# Patient Record
Sex: Male | Born: 1985 | Race: White | Hispanic: No | Marital: Single | Smoking: Never smoker
Health system: Southern US, Community
[De-identification: ages and names within clinical notes are randomized; demographics above are authoritative.]

---

## 2011-04-19 ENCOUNTER — Emergency Department (HOSPITAL_COMMUNITY): Payer: Worker's Compensation

## 2011-04-19 ENCOUNTER — Emergency Department (HOSPITAL_COMMUNITY)
Admission: EM | Admit: 2011-04-19 | Discharge: 2011-04-19 | Disposition: A | Discharge status: Home / Self Care | Payer: Worker's Compensation | Attending: Emergency Medicine | Admitting: Emergency Medicine

## 2011-04-19 DIAGNOSIS — M545 Low back pain, unspecified: Secondary | ICD-10-CM | POA: Insufficient documentation

## 2011-04-19 DIAGNOSIS — Z23 Encounter for immunization: Secondary | ICD-10-CM | POA: Insufficient documentation

## 2011-04-19 DIAGNOSIS — Y92410 Unspecified street and highway as the place of occurrence of the external cause: Secondary | ICD-10-CM | POA: Insufficient documentation

## 2011-04-19 DIAGNOSIS — T148XXA Other injury of unspecified body region, initial encounter: Secondary | ICD-10-CM

## 2011-04-19 DIAGNOSIS — IMO0002 Reserved for concepts with insufficient information to code with codable children: Secondary | ICD-10-CM | POA: Insufficient documentation

## 2011-04-19 MED ORDER — OXYCODONE-ACETAMINOPHEN 5-325 MG PO TABS: 1.0000 | ORAL_TABLET | ORAL | Status: AC | PRN

## 2011-04-19 MED ORDER — IBUPROFEN 600 MG PO TABS: 600.0000 mg | ORAL_TABLET | Freq: Four times a day (QID) | ORAL | Status: AC | PRN

## 2011-04-19 MED ORDER — TETANUS-DIPHTH-ACELL PERTUSSIS 5-2.5-18.5 LF-MCG/0.5 IM SUSP
0.5000 mL | Freq: Once | INTRAMUSCULAR | Status: AC
  Administered 2011-04-19: 0.5 mL via INTRAMUSCULAR
  Filled 2011-04-19: qty 0.5

## 2011-04-19 NOTE — ED Notes (Signed)
Pt on spine board, c-collar, family at bedside
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
## 2011-04-19 NOTE — ED Notes (Signed)
Pt back from xray.  Family at bedside.  nad noted

## 2011-07-14 ENCOUNTER — Other Ambulatory Visit (HOSPITAL_COMMUNITY): Payer: Self-pay | Admitting: Urology

## 2011-07-14 DIAGNOSIS — D294 Benign neoplasm of scrotum: Secondary | ICD-10-CM

## 2011-07-15 ENCOUNTER — Ambulatory Visit (HOSPITAL_COMMUNITY)
Admission: RE | Admit: 2011-07-15 | Discharge: 2011-07-15 | Disposition: A | Discharge status: Home / Self Care | Payer: BC Managed Care – PPO | Source: Ambulatory Visit | Attending: Urology | Admitting: Urology

## 2011-07-15 DIAGNOSIS — D294 Benign neoplasm of scrotum: Secondary | ICD-10-CM
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
## 2012-11-23 IMAGING — US US SCROTUM
1 series · 14 of 25 positions shown · non-contrast
Comparison: None.

CLINICAL DATA: Scrotal neoplasm.

ULTRASOUND OF SCROTUM
TECHNIQUE: Complete ultrasound examination of the testicles,
epididymis, and other scrotal structures was performed.

[Series 1: us scrotum · 0.08mm/px · 14 of 62 slices shown]
[im 1/62]
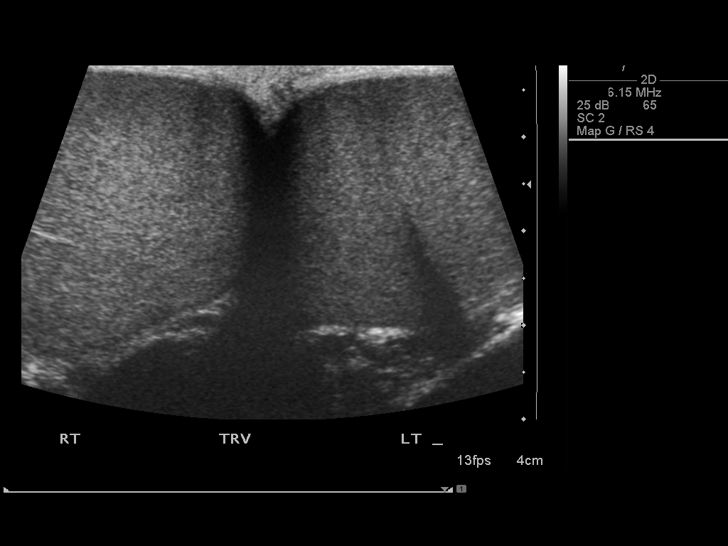
[im 6/62]
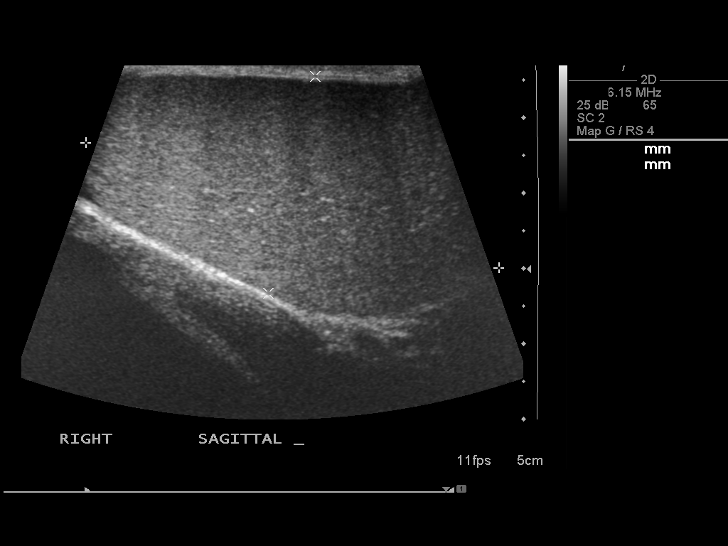
[im 11/62]
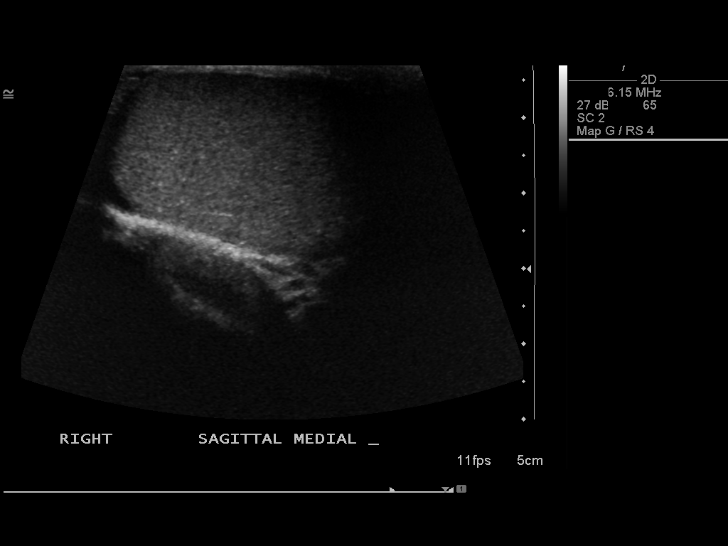
[im 16/62]
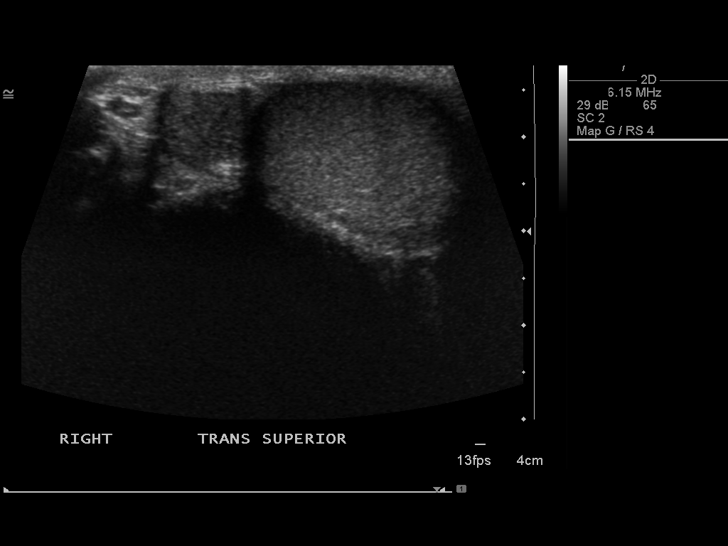
[im 21/62]
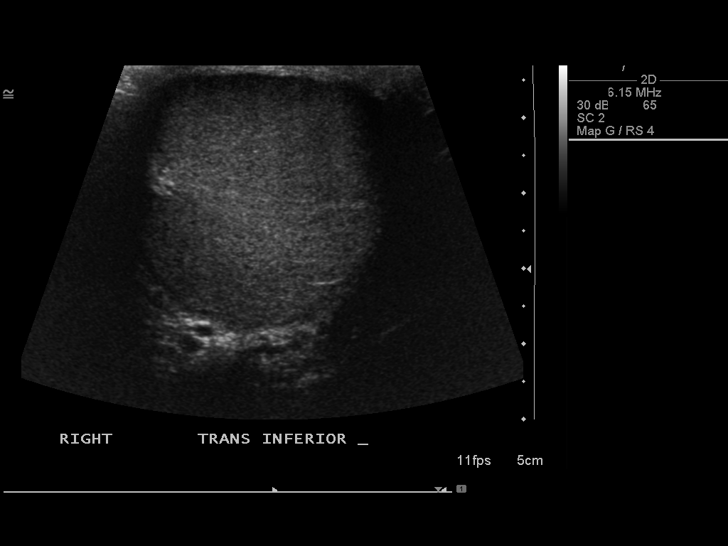
[im 23/62]
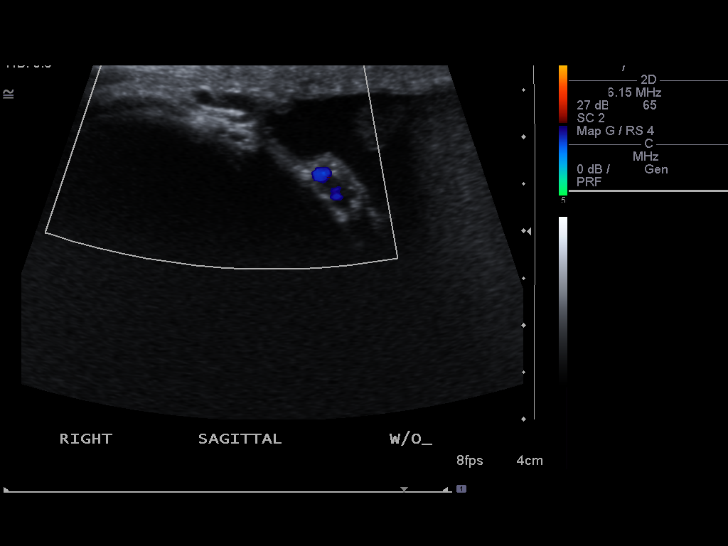
[im 28/62]
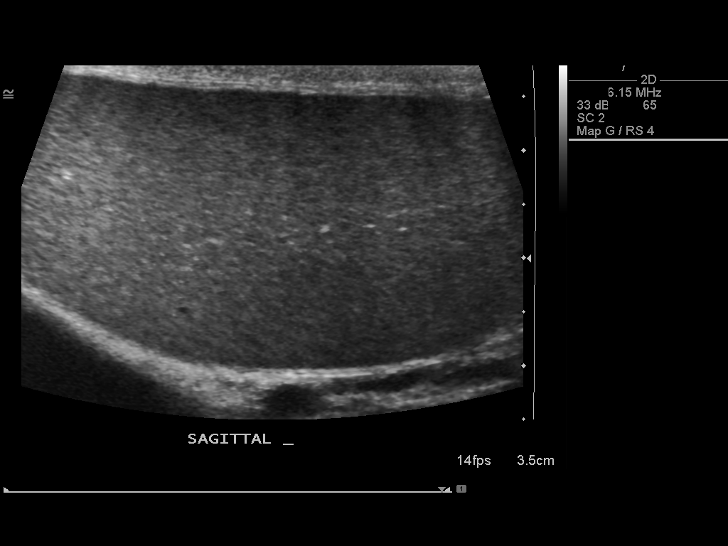
[im 34/62]
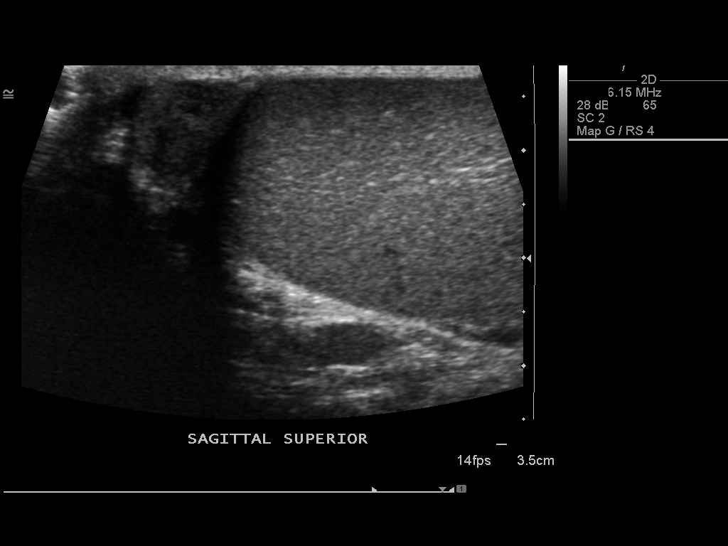
[im 39/62]
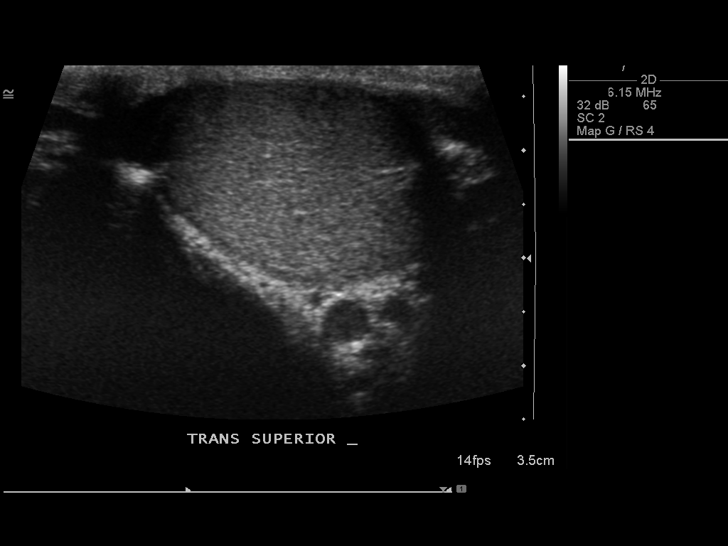
[im 41/62]
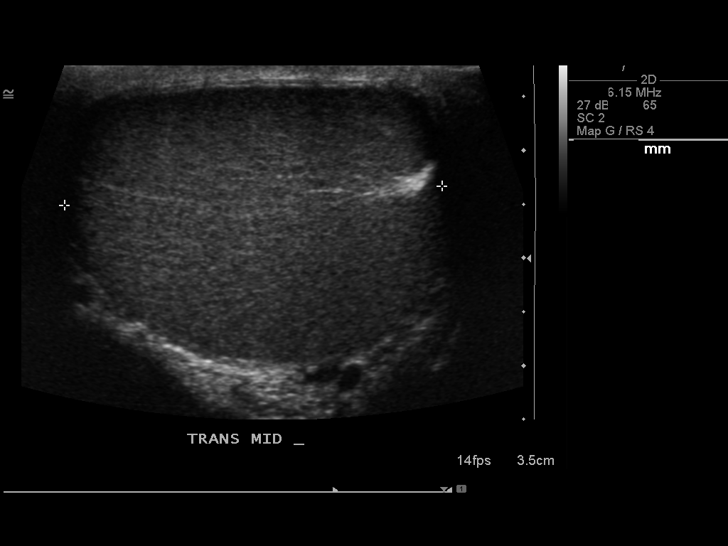
[im 46/62]
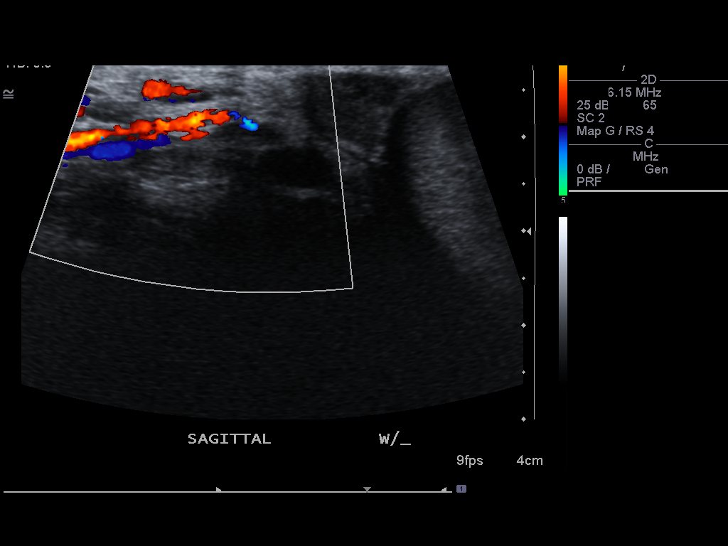
[im 51/62]
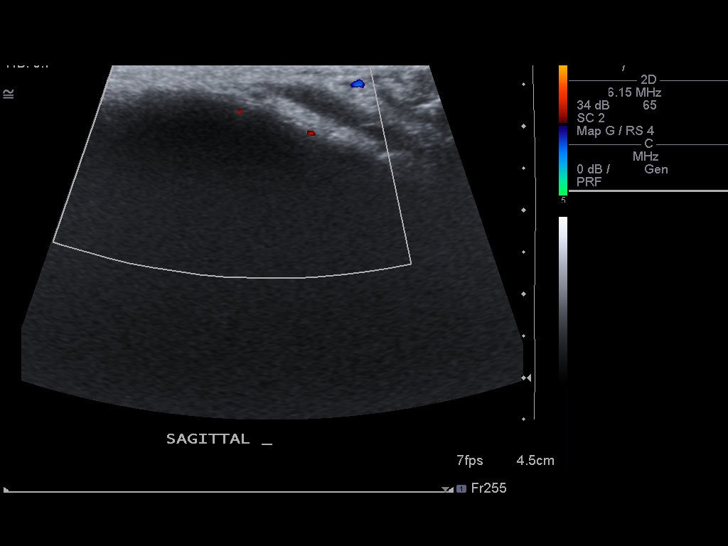
[im 56/62]
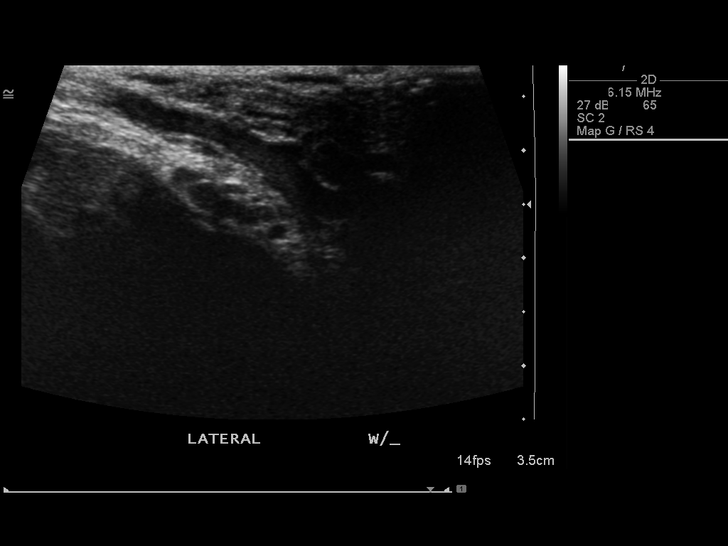
[im 62/62]
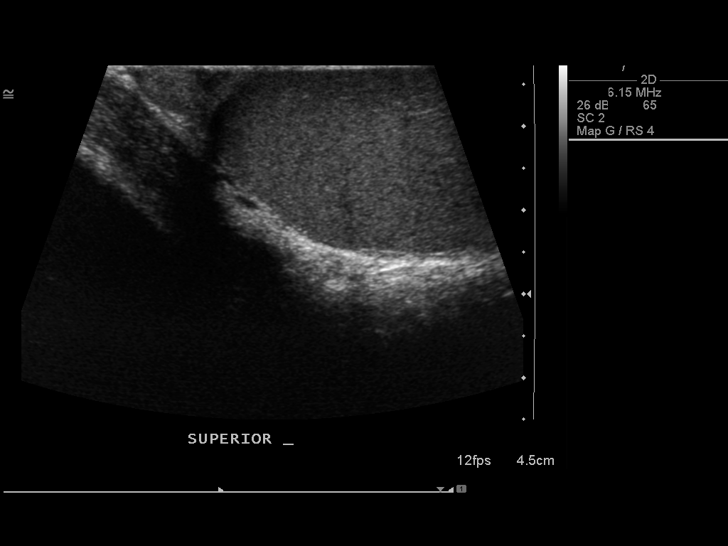

[14 of 25 positions shown; findings below may reference images not displayed]

FINDINGS: Right testis:  5.7 x 2.9 x 3.5 cm.  There is perfusion to the right
testicle.

Left testis:  4.8 x 2.3 x 3.5 cm.  There is perfusion to the left
testicle.

Right epididymis:  Normal in size and appearance.

Left epididymis:  Normal in size and appearance.

Hydrocele:  Absent.

Varicocele:  Left varicocele.
IMPRESSION: No mass is identified in the scrotum other than the left
varicocele.  The testicles are normal.

## 2020-06-11 ENCOUNTER — Emergency Department (HOSPITAL_COMMUNITY): Payer: BC Managed Care – PPO

## 2020-06-11 ENCOUNTER — Other Ambulatory Visit: Payer: Self-pay

## 2020-06-11 ENCOUNTER — Encounter (HOSPITAL_COMMUNITY): Payer: Self-pay

## 2020-06-11 DIAGNOSIS — R0602 Shortness of breath: Secondary | ICD-10-CM | POA: Diagnosis present

## 2020-06-11 DIAGNOSIS — U071 COVID-19: Secondary | ICD-10-CM | POA: Diagnosis not present

## 2020-06-11 NOTE — ED Triage Notes (Addendum)
Pt reports a positive home covid test and tested positive [facility] after a family trip. Sx since Thursday. Pt c/o generalized body aches, headache, shob, and cough.

## 2020-06-12 ENCOUNTER — Emergency Department (HOSPITAL_COMMUNITY)
Admission: EM | Admit: 2020-06-12 | Discharge: 2020-06-12 | Disposition: A | Discharge status: Home / Self Care | Payer: BC Managed Care – PPO | Attending: Emergency Medicine | Admitting: Emergency Medicine

## 2020-06-12 DIAGNOSIS — U071 COVID-19: Secondary | ICD-10-CM

## 2020-06-12 MED ORDER — ALBUTEROL SULFATE HFA 108 (90 BASE) MCG/ACT IN AERS
2.0000 | INHALATION_SPRAY | Freq: Once | RESPIRATORY_TRACT | Status: AC
  Administered 2020-06-12: 2 via RESPIRATORY_TRACT
  Filled 2020-06-12: qty 6.7

## 2020-06-12 NOTE — Discharge Instructions (Signed)
Thank you for allowing me to care for you today in the Emergency Department.   You can use 2 to 4 puffs of the albuterol inhaler every 4-6 hours as needed for shortness of breath.  Guaifenesin is available over-the-counter and can help with nasal drainage and congestion.  Use as directed on the label.  You can continue to use Tylenol and Motrin for headache, body aches, and fever.  Please note that your symptoms can last several weeks.  Some individuals may develop long COVID, which can last for months.  You can follow-up with your primary care provider or with the COVID-19 clinic for reevaluation as needed.  Return to the emergency department if you develop respiratory distress, if you pass out, if your fingers or lips turn blue, if you become very sleepy and difficult to wake up, if you develop uncontrollable vomiting and stomach in urine, or have other new, concerning symptoms.

## 2020-06-12 NOTE — ED Provider Notes (Signed)
[facility]-EMERGENCY DEPT Provider Note   CSN: [id number] Arrival date & time: 06/11/20  2257     History Chief Complaint  Patient presents with  . Generalized Body Aches    Dean Mccoy is a 35 y.o. male with no significant past medical history who presents the emergency department with a chief complaint of shortness of breath.  The patient reports that he tested positive for COVID-19 on 12/30 after several family members tested positive during a road trip.  He reports that he has had loss of sense of taste and smell, sore throat, headache, myalgias, fatigue, chills, fever, cough.  He has had mild shortness of breath that has been gradually worsening.  No chest pain.  No vomiting.  No abdominal pain, neck pain or stiffness, confusion, or rash.  He has been treating his symptoms with Mucinex, Tylenol, and Motrin.  His primary concern is that his shortness of breath has been gradually worsening and that all of his family members who tested positive the same day had their symptoms resolved in 3 to 4 days, but his have persisted for a week.  He also reports that he has been having postnasal drip despite using over-the-counter medication and is concerned it may cause complications. He is unvaccinated against COVID-19.  The history is provided by the patient and medical records. No language interpreter was used.       History reviewed. No pertinent past medical history.  There are no problems to display for this patient.   History reviewed. No pertinent surgical history.     No family history on file.  Social History   Tobacco Use  . Smoking status: Never Smoker  . Smokeless tobacco: Never Used  Substance Use Topics  . Alcohol use: No  . Drug use: No    Home Medications Prior to Admission medications   Not on File    Allergies    Patient has no known allergies.  Review of Systems   Review of Systems  Constitutional: Positive for chills and fever.  Negative for appetite change.  HENT: Positive for congestion, postnasal drip and sore throat.        Loss of sense of taste and smell  Eyes: Negative for visual disturbance.  Respiratory: Positive for cough and shortness of breath.   Cardiovascular: Negative for chest pain, palpitations and leg swelling.  Gastrointestinal: Positive for diarrhea. Negative for abdominal pain, nausea and vomiting.  Genitourinary: Negative for dysuria.  Musculoskeletal: Negative for back pain and myalgias.  Skin: Negative for rash.  Allergic/Immunologic: Negative for immunocompromised state.  Neurological: Negative for seizures, syncope, weakness, numbness and headaches.  Psychiatric/Behavioral: Negative for confusion.    Physical Exam Updated Vital Signs BP 131/77   Pulse 80   Temp 98.2 F (36.8 C)   Resp 17   SpO2 99%   Physical Exam Vitals and nursing note reviewed.  Constitutional:      Appearance: He is well-developed. He is not ill-appearing, toxic-appearing or diaphoretic.  HENT:     Head: Normocephalic.     Right Ear: Tympanic membrane, ear canal and external ear normal.     Left Ear: Tympanic membrane, ear canal and external ear normal.     Nose: Congestion present. No rhinorrhea.     Mouth/Throat:     Mouth: Mucous membranes are moist.     Pharynx: Posterior oropharyngeal erythema present.  Eyes:     General: No scleral icterus.    Conjunctiva/sclera: Conjunctivae normal.  Cardiovascular:  Rate and Rhythm: Normal rate and regular rhythm.     Pulses: Normal pulses.     Heart sounds: Normal heart sounds. No murmur heard. No friction rub. No gallop.   Pulmonary:     Effort: Pulmonary effort is normal. No respiratory distress.     Breath sounds: No stridor. No wheezing, rhonchi or rales.     Comments: Lungs are clear to auscultation bilaterally with good air movement throughout.  No increased work of breathing.  Able to speak in complete, fluent sentences without increased work of  breathing. Chest:     Chest wall: No tenderness.  Abdominal:     General: There is no distension.     Palpations: Abdomen is soft. There is no mass.     Tenderness: There is no abdominal tenderness. There is no right CVA tenderness, left CVA tenderness, guarding or rebound.     Hernia: No hernia is present.  Musculoskeletal:     Cervical back: Neck supple.  Skin:    General: Skin is warm and dry.  Neurological:     Mental Status: He is alert.  Psychiatric:        Behavior: Behavior normal.     ED Results / Procedures / Treatments   Labs (all labs ordered are listed, but only abnormal results are displayed) Labs Reviewed - No data to display  EKG None  Radiology DG Chest 2 View  Result Date: 06/11/2020 CLINICAL DATA:  35 year old male with shortness of breath and cough. Positive COVID-19. EXAM: CHEST - 2 VIEW COMPARISON:  None. FINDINGS: The heart size and mediastinal contours are within normal limits. Both lungs are clear. The visualized skeletal structures are unremarkable. IMPRESSION: No active cardiopulmonary disease. Electronically Signed   By: Anner Crete M.D.   On: 06/11/2020 23:32    Procedures Procedures (including critical care time)  Medications Ordered in ED Medications  albuterol (VENTOLIN HFA) 108 (90 Base) MCG/ACT inhaler 2 puff (2 puffs Inhalation Given 06/12/20 0521)    ED Course  I have reviewed the triage vital signs and the nursing notes.  Pertinent labs & imaging results that were available during my care of the patient were reviewed by me and considered in my medical decision making (see chart for details).    MDM Rules/Calculators/A&P                          35 year old male with no significant past medical history who is unvaccinated against COVID-19 who tested positive with an at-home test on 12/30 and then later had a second positive test [facility] after going on a road trip with several family members who also tested positive for COVID-19.   He presents today for continued symptoms after 1 week with mild, gradually worsening shortness of breath.   Vital signs are stable.  Oxygen saturation is 96%.  Physical exam is reassuring with no increased work of breathing.  Lungs are clear to auscultation bilaterally.  Discussed course of the viral illness and that he may have symptoms for several weeks.  Also discussed that there is the potential to develop long COVID.  We will discharge the patient with an albuterol inhaler.  Recommended guaifenesin as an expectorant.  All questions answered.  Home quarantine per cautions discussed.  The patient is hemodynamically stable and in no acute distress.  Safer discharge to home with outpatient follow-up as needed.  Final Clinical Impression(s) / ED Diagnoses Final diagnoses:  [id number]  Rx / DC Orders ED Discharge Orders    None       Joanne Gavel, PA-C 06/12/20 [id number]    Fatima Blank, MD 06/12/20 [phone]

## 2021-10-21 IMAGING — CR DG CHEST 2V
2 series · 2 of 2 positions shown · non-contrast
Comparison: None.

CLINICAL DATA: 34-year-old male with shortness of breath and cough.
Positive [id number].

EXAM:
CHEST - 2 VIEW

[w chest pa]
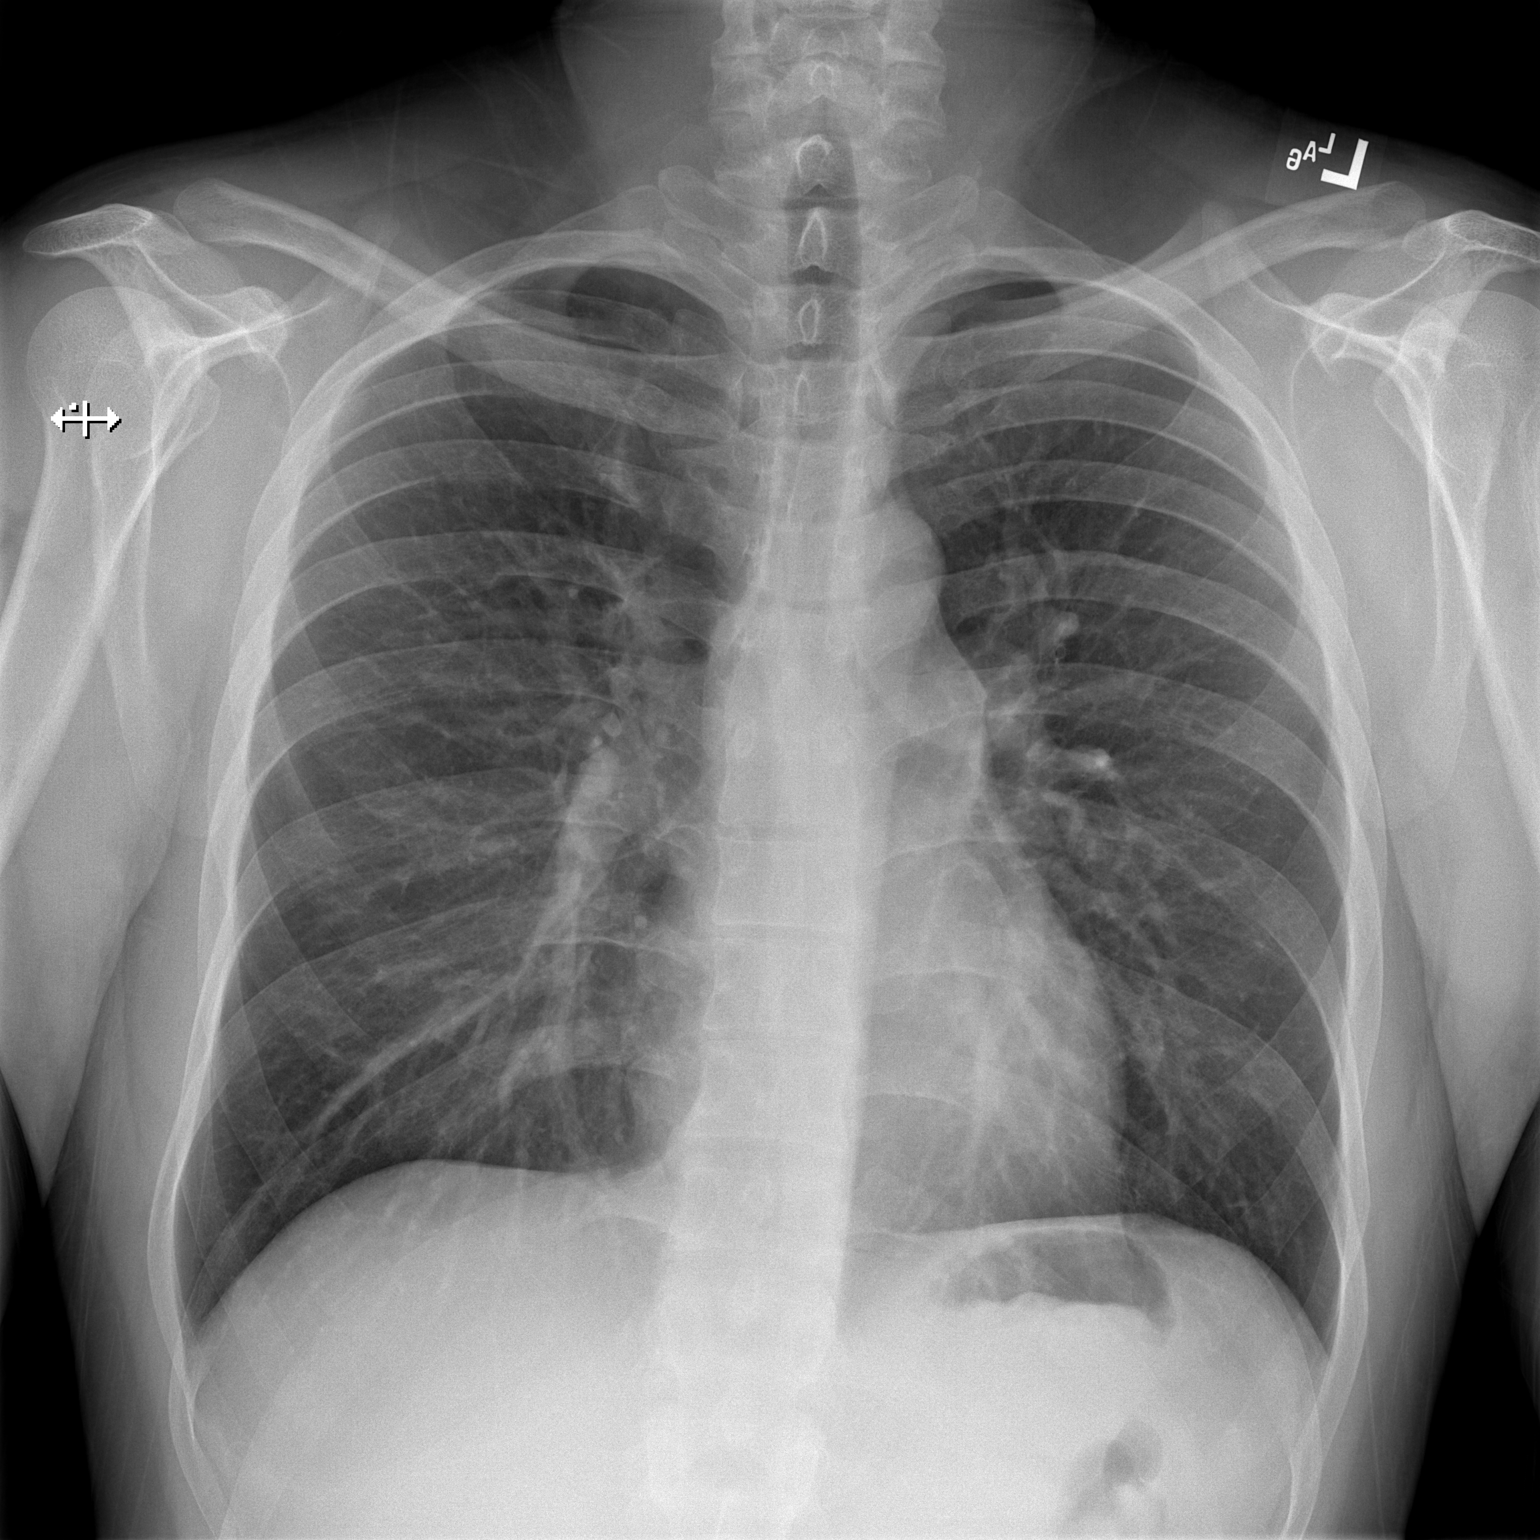

[w chest lat]
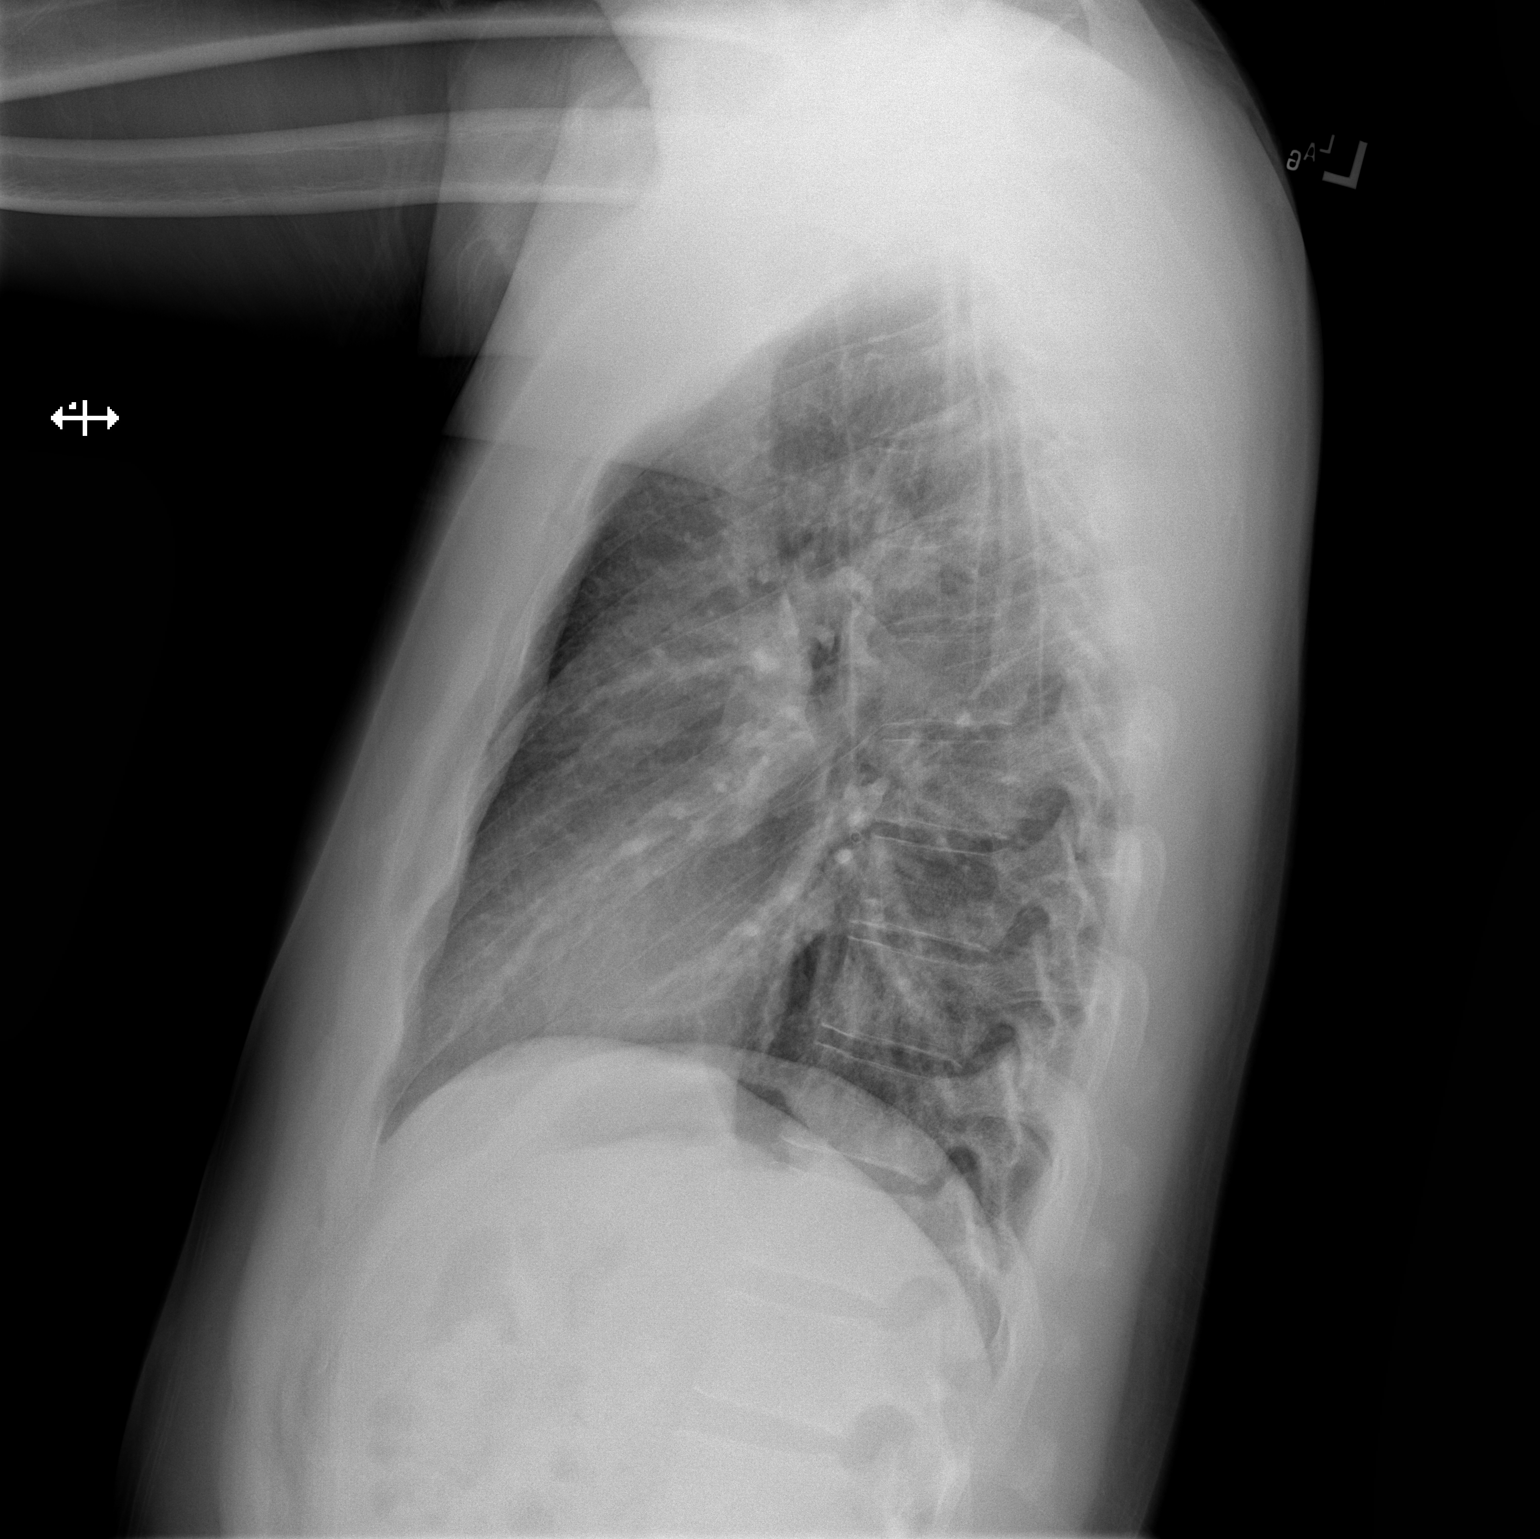

[2 of 2 positions shown; findings below may reference images not displayed]

FINDINGS: The heart size and mediastinal contours are within normal limits.
Both lungs are clear. The visualized skeletal structures are
unremarkable.
IMPRESSION: No active cardiopulmonary disease.
